# Patient Record
Sex: Female | Born: 1985 | Race: White | Hispanic: No | Marital: Single | Smoking: Never smoker
Health system: Southern US, Community
[De-identification: ages and names within clinical notes are randomized; demographics above are authoritative.]

---

## 2005-07-20 ENCOUNTER — Other Ambulatory Visit: Admission: RE | Admit: 2005-07-20 | Discharge: 2005-07-20 | Payer: Self-pay | Admitting: Gynecology

## 2016-04-09 ENCOUNTER — Ambulatory Visit (HOSPITAL_COMMUNITY)
Admission: EM | Admit: 2016-04-09 | Discharge: 2016-04-09 | Disposition: A | Discharge status: Home / Self Care | Payer: BLUE CROSS/BLUE SHIELD | Attending: Emergency Medicine | Admitting: Emergency Medicine

## 2016-04-09 ENCOUNTER — Encounter (HOSPITAL_COMMUNITY): Payer: Self-pay | Admitting: Emergency Medicine

## 2016-04-09 DIAGNOSIS — N898 Other specified noninflammatory disorders of vagina: Secondary | ICD-10-CM

## 2016-04-09 MED ORDER — HYDROCODONE-ACETAMINOPHEN 5-325 MG PO TABS: 1.0000 | ORAL_TABLET | ORAL | 0 refills | Status: AC | PRN

## 2016-04-09 NOTE — Discharge Instructions (Signed)
Please call your OB's office first thing Monday morning to be seen. Use the Vicodin every 4-6 hours as needed for pain. If you develop fevers or worsening pain despite the medication, please go to [facility].

## 2016-04-09 NOTE — ED Provider Notes (Signed)
[facility]    CSN: [id number] Arrival date & time: 04/09/16  1941     History   Chief Complaint Chief Complaint  Patient presents with  . Abdominal Pain    HPI Yvonne Gardner is a 30 y.o. female.   HPI  She is a 30 year old woman here for evaluation of abdominal pain and vaginal discharge. She states she had a LEEP procedure done on Wednesday. Yesterday, she developed some lower abdominal pain and foul-smelling discharge. Symptoms have worsened today. She has tried taking ibuprofen without improvement. She reports feeling nauseous from the pain, but no vomiting. Denies any fevers. Dr. Earnest RosierMeisnger is her OB/GYN.  History reviewed. No pertinent past medical history.  There are no active problems to display for this patient.   History reviewed. No pertinent surgical history.  OB History    No data available       Home Medications    Prior to Admission medications   Medication Sig Start Date End Date Taking? Authorizing Provider  levonorgestrel (MIRENA, 52 MG,) 20 MCG/24HR IUD 1 each by Intrauterine route once.   Yes Historical Provider, MD  Norethindrone Acetate-Ethinyl Estrad-FE (LOESTRIN 24 FE) 1-20 MG-MCG(24) tablet Take 1 tablet by mouth daily.   Yes Historical Provider, MD  HYDROcodone-acetaminophen (NORCO) 5-325 MG tablet Take 1-2 tablets by mouth every 4 (four) hours as needed for moderate pain. 04/09/16   Charm RingsErin J Arhianna Ebey, MD    Family History History reviewed. No pertinent family history.  Social History Social History  Substance Use Topics  . Smoking status: Never Smoker  . Smokeless tobacco: Never Used  . Alcohol use No     Allergies   Floraquin [iodoquinol]; Prednisolone; and Penicillins   Review of Systems Review of Systems As in history of present illness  Physical Exam Triage Vital Signs ED Triage Vitals  Enc Vitals Group     BP 04/09/16 2010 136/78     Pulse Rate 04/09/16 2010 82     Resp 04/09/16 2010 16     Temp 04/09/16  2010 97.8 F (36.6 C)     Temp Source 04/09/16 2010 Oral     SpO2 04/09/16 2010 100 %     Weight --      Height --      Head Circumference --      Peak Flow --      Pain Score 04/09/16 2009 10     Pain Loc --      Pain Edu? --      Excl. in GC? --    No data found.   Updated Vital Signs BP 136/78 (BP Location: Left Arm)   Pulse 82   Temp 97.8 F (36.6 C) (Oral)   Resp 16   SpO2 100%   Visual Acuity Right Eye Distance:   Left Eye Distance:   Bilateral Distance:    Right Eye Near:   Left Eye Near:    Bilateral Near:     Physical Exam  Constitutional: She is oriented to person, place, and time. She appears well-developed and well-nourished. No distress.  Cardiovascular: Normal rate.   Pulmonary/Chest: Effort normal.  Genitourinary:  Genitourinary Comments: Patient refuses pelvic exam  Neurological: She is alert and oriented to person, place, and time.     UC Treatments / Results  Labs (all labs ordered are listed, but only abnormal results are displayed) Labs Reviewed - No data to display  EKG  EKG Interpretation None       Radiology  No results found.  Procedures Procedures (including critical care time)  Medications Ordered in UC Medications - No data to display   Initial Impression / Assessment and Plan / UC Course  I have reviewed the triage vital signs and the nursing notes.  Pertinent labs & imaging results that were available during my care of the patient were reviewed by me and considered in my medical decision making (see chart for details).  Clinical Course     I called and spoke with Dr. Ellyn HackBovard, OB[facility]GY[facility]. She recommended a pelvic exam. I went back in and discussed with the patient that a pelvic exam with needed to assess the cervix and how it was healing. Patient continues to refuse a pelvic exam due to concerns of pain. We'll provide prescription for hydrocodone to use as needed for pain. [facility] to [facility] first thing Monday morning for an appointment. If pain is worsening despite pain medication or she develops fevers, she is to [facility].  Final Clinical Impressions(s) / UC Diagnoses   Final diagnoses:  Vaginal discharge    New Prescriptions Discharge Medication List as of 04/09/2016  8:31 PM    START taking these medications   Details  HYDROcodone-acetaminophen (NORCO) 5-325 MG tablet Take 1-2 tablets by mouth every 4 (four) hours as needed for moderate pain., Starting Sat 04/09/2016, Print         Charm RingsErin J Nikea Settle, MD 04/09/16 2037

## 2016-04-09 NOTE — ED Triage Notes (Signed)
[facility] with a complaint of lower abdominal pain and foul smelling discharge. Patient states that she had a LEEP procedure down on Weds. Of this week. Her GYN told her if she started to experience pain to be seen. She states that she has a foul smelling, blood tinged discharge.

## 2017-06-28 DIAGNOSIS — J0100 Acute maxillary sinusitis, unspecified: Secondary | ICD-10-CM | POA: Diagnosis not present

## 2017-07-19 DIAGNOSIS — F900 Attention-deficit hyperactivity disorder, predominantly inattentive type: Secondary | ICD-10-CM | POA: Diagnosis not present

## 2017-10-11 DIAGNOSIS — F900 Attention-deficit hyperactivity disorder, predominantly inattentive type: Secondary | ICD-10-CM | POA: Diagnosis not present

## 2017-12-04 DIAGNOSIS — N870 Mild cervical dysplasia: Secondary | ICD-10-CM | POA: Diagnosis not present

## 2017-12-04 DIAGNOSIS — Z130 Encounter for screening for diseases of the blood and blood-forming organs and certain disorders involving the immune mechanism: Secondary | ICD-10-CM | POA: Diagnosis not present

## 2017-12-04 DIAGNOSIS — Z1389 Encounter for screening for other disorder: Secondary | ICD-10-CM | POA: Diagnosis not present

## 2017-12-04 DIAGNOSIS — Z01419 Encounter for gynecological examination (general) (routine) without abnormal findings: Secondary | ICD-10-CM | POA: Diagnosis not present

## 2017-12-04 DIAGNOSIS — Z87410 Personal history of cervical dysplasia: Secondary | ICD-10-CM | POA: Diagnosis not present

## 2017-12-19 DIAGNOSIS — Z30433 Encounter for removal and reinsertion of intrauterine contraceptive device: Secondary | ICD-10-CM | POA: Diagnosis not present

## 2018-01-03 DIAGNOSIS — F900 Attention-deficit hyperactivity disorder, predominantly inattentive type: Secondary | ICD-10-CM | POA: Diagnosis not present

## 2018-04-11 DIAGNOSIS — F900 Attention-deficit hyperactivity disorder, predominantly inattentive type: Secondary | ICD-10-CM | POA: Diagnosis not present

## 2018-07-04 DIAGNOSIS — F900 Attention-deficit hyperactivity disorder, predominantly inattentive type: Secondary | ICD-10-CM | POA: Diagnosis not present

## 2018-08-06 DIAGNOSIS — J0100 Acute maxillary sinusitis, unspecified: Secondary | ICD-10-CM | POA: Diagnosis not present

## 2018-08-10 ENCOUNTER — Ambulatory Visit
Admission: RE | Admit: 2018-08-10 | Discharge: 2018-08-10 | Disposition: A | Discharge status: Home / Self Care | Payer: BLUE CROSS/BLUE SHIELD | Source: Ambulatory Visit | Attending: Family Medicine | Admitting: Family Medicine

## 2018-08-10 ENCOUNTER — Other Ambulatory Visit: Payer: Self-pay | Admitting: Family Medicine

## 2018-08-10 ENCOUNTER — Other Ambulatory Visit: Payer: Self-pay

## 2018-08-10 DIAGNOSIS — Y999 Unspecified external cause status: Secondary | ICD-10-CM | POA: Diagnosis not present

## 2018-08-10 DIAGNOSIS — S0033XA Contusion of nose, initial encounter: Secondary | ICD-10-CM | POA: Diagnosis not present

## 2018-08-10 DIAGNOSIS — J3489 Other specified disorders of nose and nasal sinuses: Secondary | ICD-10-CM

## 2018-08-10 DIAGNOSIS — S0993XA Unspecified injury of face, initial encounter: Secondary | ICD-10-CM | POA: Diagnosis not present

## 2018-08-10 DIAGNOSIS — S0992XD Unspecified injury of nose, subsequent encounter: Secondary | ICD-10-CM

## 2018-08-10 DIAGNOSIS — W228XXA Striking against or struck by other objects, initial encounter: Secondary | ICD-10-CM | POA: Diagnosis not present

## 2018-08-10 DIAGNOSIS — R51 Headache: Secondary | ICD-10-CM | POA: Diagnosis not present

## 2018-08-10 DIAGNOSIS — W541XXA Struck by dog, initial encounter: Secondary | ICD-10-CM | POA: Diagnosis not present

## 2018-09-19 DIAGNOSIS — F900 Attention-deficit hyperactivity disorder, predominantly inattentive type: Secondary | ICD-10-CM | POA: Diagnosis not present

## 2018-12-19 DIAGNOSIS — F900 Attention-deficit hyperactivity disorder, predominantly inattentive type: Secondary | ICD-10-CM | POA: Diagnosis not present

## 2018-12-31 DIAGNOSIS — Z01419 Encounter for gynecological examination (general) (routine) without abnormal findings: Secondary | ICD-10-CM | POA: Diagnosis not present

## 2018-12-31 DIAGNOSIS — Z130 Encounter for screening for diseases of the blood and blood-forming organs and certain disorders involving the immune mechanism: Secondary | ICD-10-CM | POA: Diagnosis not present

## 2018-12-31 DIAGNOSIS — Z87410 Personal history of cervical dysplasia: Secondary | ICD-10-CM | POA: Diagnosis not present

## 2018-12-31 DIAGNOSIS — Z1389 Encounter for screening for other disorder: Secondary | ICD-10-CM | POA: Diagnosis not present

## 2019-01-01 DIAGNOSIS — Z87410 Personal history of cervical dysplasia: Secondary | ICD-10-CM | POA: Diagnosis not present

## 2019-03-20 DIAGNOSIS — F900 Attention-deficit hyperactivity disorder, predominantly inattentive type: Secondary | ICD-10-CM | POA: Diagnosis not present

## 2019-06-24 DIAGNOSIS — F900 Attention-deficit hyperactivity disorder, predominantly inattentive type: Secondary | ICD-10-CM | POA: Diagnosis not present

## 2019-08-21 DIAGNOSIS — F900 Attention-deficit hyperactivity disorder, predominantly inattentive type: Secondary | ICD-10-CM | POA: Diagnosis not present

## 2019-11-13 DIAGNOSIS — F900 Attention-deficit hyperactivity disorder, predominantly inattentive type: Secondary | ICD-10-CM | POA: Diagnosis not present

## 2019-12-05 DIAGNOSIS — F4323 Adjustment disorder with mixed anxiety and depressed mood: Secondary | ICD-10-CM | POA: Diagnosis not present

## 2019-12-05 DIAGNOSIS — F430 Acute stress reaction: Secondary | ICD-10-CM | POA: Diagnosis not present

## 2019-12-12 DIAGNOSIS — F430 Acute stress reaction: Secondary | ICD-10-CM | POA: Diagnosis not present

## 2019-12-12 DIAGNOSIS — F4323 Adjustment disorder with mixed anxiety and depressed mood: Secondary | ICD-10-CM | POA: Diagnosis not present

## 2019-12-19 DIAGNOSIS — F4323 Adjustment disorder with mixed anxiety and depressed mood: Secondary | ICD-10-CM | POA: Diagnosis not present

## 2019-12-19 DIAGNOSIS — F430 Acute stress reaction: Secondary | ICD-10-CM | POA: Diagnosis not present

## 2019-12-26 DIAGNOSIS — F4323 Adjustment disorder with mixed anxiety and depressed mood: Secondary | ICD-10-CM | POA: Diagnosis not present

## 2019-12-26 DIAGNOSIS — F430 Acute stress reaction: Secondary | ICD-10-CM | POA: Diagnosis not present

## 2020-01-02 DIAGNOSIS — F4323 Adjustment disorder with mixed anxiety and depressed mood: Secondary | ICD-10-CM | POA: Diagnosis not present

## 2020-01-09 DIAGNOSIS — F4323 Adjustment disorder with mixed anxiety and depressed mood: Secondary | ICD-10-CM | POA: Diagnosis not present

## 2020-01-10 DIAGNOSIS — Z77098 Contact with and (suspected) exposure to other hazardous, chiefly nonmedicinal, chemicals: Secondary | ICD-10-CM | POA: Diagnosis not present

## 2020-01-16 DIAGNOSIS — F4323 Adjustment disorder with mixed anxiety and depressed mood: Secondary | ICD-10-CM | POA: Diagnosis not present

## 2020-01-23 DIAGNOSIS — F4323 Adjustment disorder with mixed anxiety and depressed mood: Secondary | ICD-10-CM | POA: Diagnosis not present

## 2020-01-30 DIAGNOSIS — F430 Acute stress reaction: Secondary | ICD-10-CM | POA: Diagnosis not present

## 2020-02-06 DIAGNOSIS — F430 Acute stress reaction: Secondary | ICD-10-CM | POA: Diagnosis not present

## 2020-02-12 DIAGNOSIS — F900 Attention-deficit hyperactivity disorder, predominantly inattentive type: Secondary | ICD-10-CM | POA: Diagnosis not present

## 2020-02-13 DIAGNOSIS — F430 Acute stress reaction: Secondary | ICD-10-CM | POA: Diagnosis not present

## 2020-02-18 DIAGNOSIS — Z77011 Contact with and (suspected) exposure to lead: Secondary | ICD-10-CM | POA: Diagnosis not present

## 2020-02-18 DIAGNOSIS — R5382 Chronic fatigue, unspecified: Secondary | ICD-10-CM | POA: Diagnosis not present

## 2020-02-20 DIAGNOSIS — F430 Acute stress reaction: Secondary | ICD-10-CM | POA: Diagnosis not present

## 2020-02-25 ENCOUNTER — Ambulatory Visit
Admission: RE | Admit: 2020-02-25 | Discharge: 2020-02-25 | Disposition: A | Discharge status: Home / Self Care | Payer: BLUE CROSS/BLUE SHIELD | Source: Ambulatory Visit | Attending: Family Medicine | Admitting: Family Medicine

## 2020-02-25 ENCOUNTER — Other Ambulatory Visit: Payer: Self-pay | Admitting: Family Medicine

## 2020-02-25 DIAGNOSIS — R059 Cough, unspecified: Secondary | ICD-10-CM

## 2020-02-27 DIAGNOSIS — F430 Acute stress reaction: Secondary | ICD-10-CM | POA: Diagnosis not present

## 2020-03-09 DIAGNOSIS — Z20822 Contact with and (suspected) exposure to covid-19: Secondary | ICD-10-CM | POA: Diagnosis not present

## 2020-03-10 DIAGNOSIS — R059 Cough, unspecified: Secondary | ICD-10-CM | POA: Diagnosis not present

## 2020-03-13 DIAGNOSIS — Z20828 Contact with and (suspected) exposure to other viral communicable diseases: Secondary | ICD-10-CM | POA: Diagnosis not present

## 2020-03-19 DIAGNOSIS — F430 Acute stress reaction: Secondary | ICD-10-CM | POA: Diagnosis not present

## 2020-03-26 DIAGNOSIS — F430 Acute stress reaction: Secondary | ICD-10-CM | POA: Diagnosis not present

## 2020-04-09 DIAGNOSIS — F430 Acute stress reaction: Secondary | ICD-10-CM | POA: Diagnosis not present

## 2020-04-16 DIAGNOSIS — F430 Acute stress reaction: Secondary | ICD-10-CM | POA: Diagnosis not present

## 2020-04-23 DIAGNOSIS — F430 Acute stress reaction: Secondary | ICD-10-CM | POA: Diagnosis not present

## 2020-04-30 DIAGNOSIS — F430 Acute stress reaction: Secondary | ICD-10-CM | POA: Diagnosis not present

## 2020-05-04 DIAGNOSIS — Z20822 Contact with and (suspected) exposure to covid-19: Secondary | ICD-10-CM | POA: Diagnosis not present

## 2020-05-13 DIAGNOSIS — F900 Attention-deficit hyperactivity disorder, predominantly inattentive type: Secondary | ICD-10-CM | POA: Diagnosis not present

## 2020-05-14 DIAGNOSIS — F4323 Adjustment disorder with mixed anxiety and depressed mood: Secondary | ICD-10-CM | POA: Diagnosis not present

## 2020-05-15 DIAGNOSIS — Z20822 Contact with and (suspected) exposure to covid-19: Secondary | ICD-10-CM | POA: Diagnosis not present

## 2020-05-21 DIAGNOSIS — F4323 Adjustment disorder with mixed anxiety and depressed mood: Secondary | ICD-10-CM | POA: Diagnosis not present

## 2020-05-28 DIAGNOSIS — F4323 Adjustment disorder with mixed anxiety and depressed mood: Secondary | ICD-10-CM | POA: Diagnosis not present

## 2020-06-04 DIAGNOSIS — F4323 Adjustment disorder with mixed anxiety and depressed mood: Secondary | ICD-10-CM | POA: Diagnosis not present

## 2020-06-11 DIAGNOSIS — F4323 Adjustment disorder with mixed anxiety and depressed mood: Secondary | ICD-10-CM | POA: Diagnosis not present

## 2020-06-18 DIAGNOSIS — F4323 Adjustment disorder with mixed anxiety and depressed mood: Secondary | ICD-10-CM | POA: Diagnosis not present

## 2020-06-25 DIAGNOSIS — F4323 Adjustment disorder with mixed anxiety and depressed mood: Secondary | ICD-10-CM | POA: Diagnosis not present

## 2020-07-02 DIAGNOSIS — F4323 Adjustment disorder with mixed anxiety and depressed mood: Secondary | ICD-10-CM | POA: Diagnosis not present

## 2020-07-07 DIAGNOSIS — H00019 Hordeolum externum unspecified eye, unspecified eyelid: Secondary | ICD-10-CM | POA: Diagnosis not present

## 2020-07-09 DIAGNOSIS — F4323 Adjustment disorder with mixed anxiety and depressed mood: Secondary | ICD-10-CM | POA: Diagnosis not present

## 2020-07-23 DIAGNOSIS — F4323 Adjustment disorder with mixed anxiety and depressed mood: Secondary | ICD-10-CM | POA: Diagnosis not present

## 2020-07-30 DIAGNOSIS — F4323 Adjustment disorder with mixed anxiety and depressed mood: Secondary | ICD-10-CM | POA: Diagnosis not present

## 2020-08-06 DIAGNOSIS — F4310 Post-traumatic stress disorder, unspecified: Secondary | ICD-10-CM | POA: Diagnosis not present

## 2020-08-12 DIAGNOSIS — F900 Attention-deficit hyperactivity disorder, predominantly inattentive type: Secondary | ICD-10-CM | POA: Diagnosis not present

## 2020-08-13 DIAGNOSIS — F4310 Post-traumatic stress disorder, unspecified: Secondary | ICD-10-CM | POA: Diagnosis not present

## 2020-08-20 DIAGNOSIS — F4310 Post-traumatic stress disorder, unspecified: Secondary | ICD-10-CM | POA: Diagnosis not present

## 2020-08-27 DIAGNOSIS — F4310 Post-traumatic stress disorder, unspecified: Secondary | ICD-10-CM | POA: Diagnosis not present

## 2020-09-03 DIAGNOSIS — F4310 Post-traumatic stress disorder, unspecified: Secondary | ICD-10-CM | POA: Diagnosis not present

## 2020-09-10 DIAGNOSIS — F4323 Adjustment disorder with mixed anxiety and depressed mood: Secondary | ICD-10-CM | POA: Diagnosis not present

## 2020-09-17 DIAGNOSIS — F4310 Post-traumatic stress disorder, unspecified: Secondary | ICD-10-CM | POA: Diagnosis not present

## 2020-09-24 DIAGNOSIS — F4310 Post-traumatic stress disorder, unspecified: Secondary | ICD-10-CM | POA: Diagnosis not present

## 2020-10-01 DIAGNOSIS — F4310 Post-traumatic stress disorder, unspecified: Secondary | ICD-10-CM | POA: Diagnosis not present

## 2020-10-08 DIAGNOSIS — F4310 Post-traumatic stress disorder, unspecified: Secondary | ICD-10-CM | POA: Diagnosis not present

## 2020-10-15 DIAGNOSIS — F4310 Post-traumatic stress disorder, unspecified: Secondary | ICD-10-CM | POA: Diagnosis not present

## 2020-10-22 DIAGNOSIS — F4310 Post-traumatic stress disorder, unspecified: Secondary | ICD-10-CM | POA: Diagnosis not present

## 2020-10-29 DIAGNOSIS — F4310 Post-traumatic stress disorder, unspecified: Secondary | ICD-10-CM | POA: Diagnosis not present

## 2020-11-05 DIAGNOSIS — F4310 Post-traumatic stress disorder, unspecified: Secondary | ICD-10-CM | POA: Diagnosis not present

## 2020-11-18 DIAGNOSIS — F900 Attention-deficit hyperactivity disorder, predominantly inattentive type: Secondary | ICD-10-CM | POA: Diagnosis not present

## 2021-01-23 DIAGNOSIS — R6883 Chills (without fever): Secondary | ICD-10-CM | POA: Diagnosis not present

## 2021-01-23 DIAGNOSIS — M7989 Other specified soft tissue disorders: Secondary | ICD-10-CM | POA: Diagnosis not present

## 2021-01-23 DIAGNOSIS — M79606 Pain in leg, unspecified: Secondary | ICD-10-CM | POA: Diagnosis not present

## 2021-01-23 DIAGNOSIS — R233 Spontaneous ecchymoses: Secondary | ICD-10-CM | POA: Diagnosis not present

## 2021-02-10 DIAGNOSIS — F900 Attention-deficit hyperactivity disorder, predominantly inattentive type: Secondary | ICD-10-CM | POA: Diagnosis not present

## 2021-05-12 DIAGNOSIS — F900 Attention-deficit hyperactivity disorder, predominantly inattentive type: Secondary | ICD-10-CM | POA: Diagnosis not present

## 2021-07-07 DIAGNOSIS — F900 Attention-deficit hyperactivity disorder, predominantly inattentive type: Secondary | ICD-10-CM | POA: Diagnosis not present

## 2021-09-01 DIAGNOSIS — F900 Attention-deficit hyperactivity disorder, predominantly inattentive type: Secondary | ICD-10-CM | POA: Diagnosis not present

## 2021-12-25 IMAGING — CR DG CHEST 2V
2 series · 2 of 2 positions shown · non-contrast
Comparison: None.

CLINICAL DATA: Cough

EXAM:
CHEST - 2 VIEW

[w chest pa]
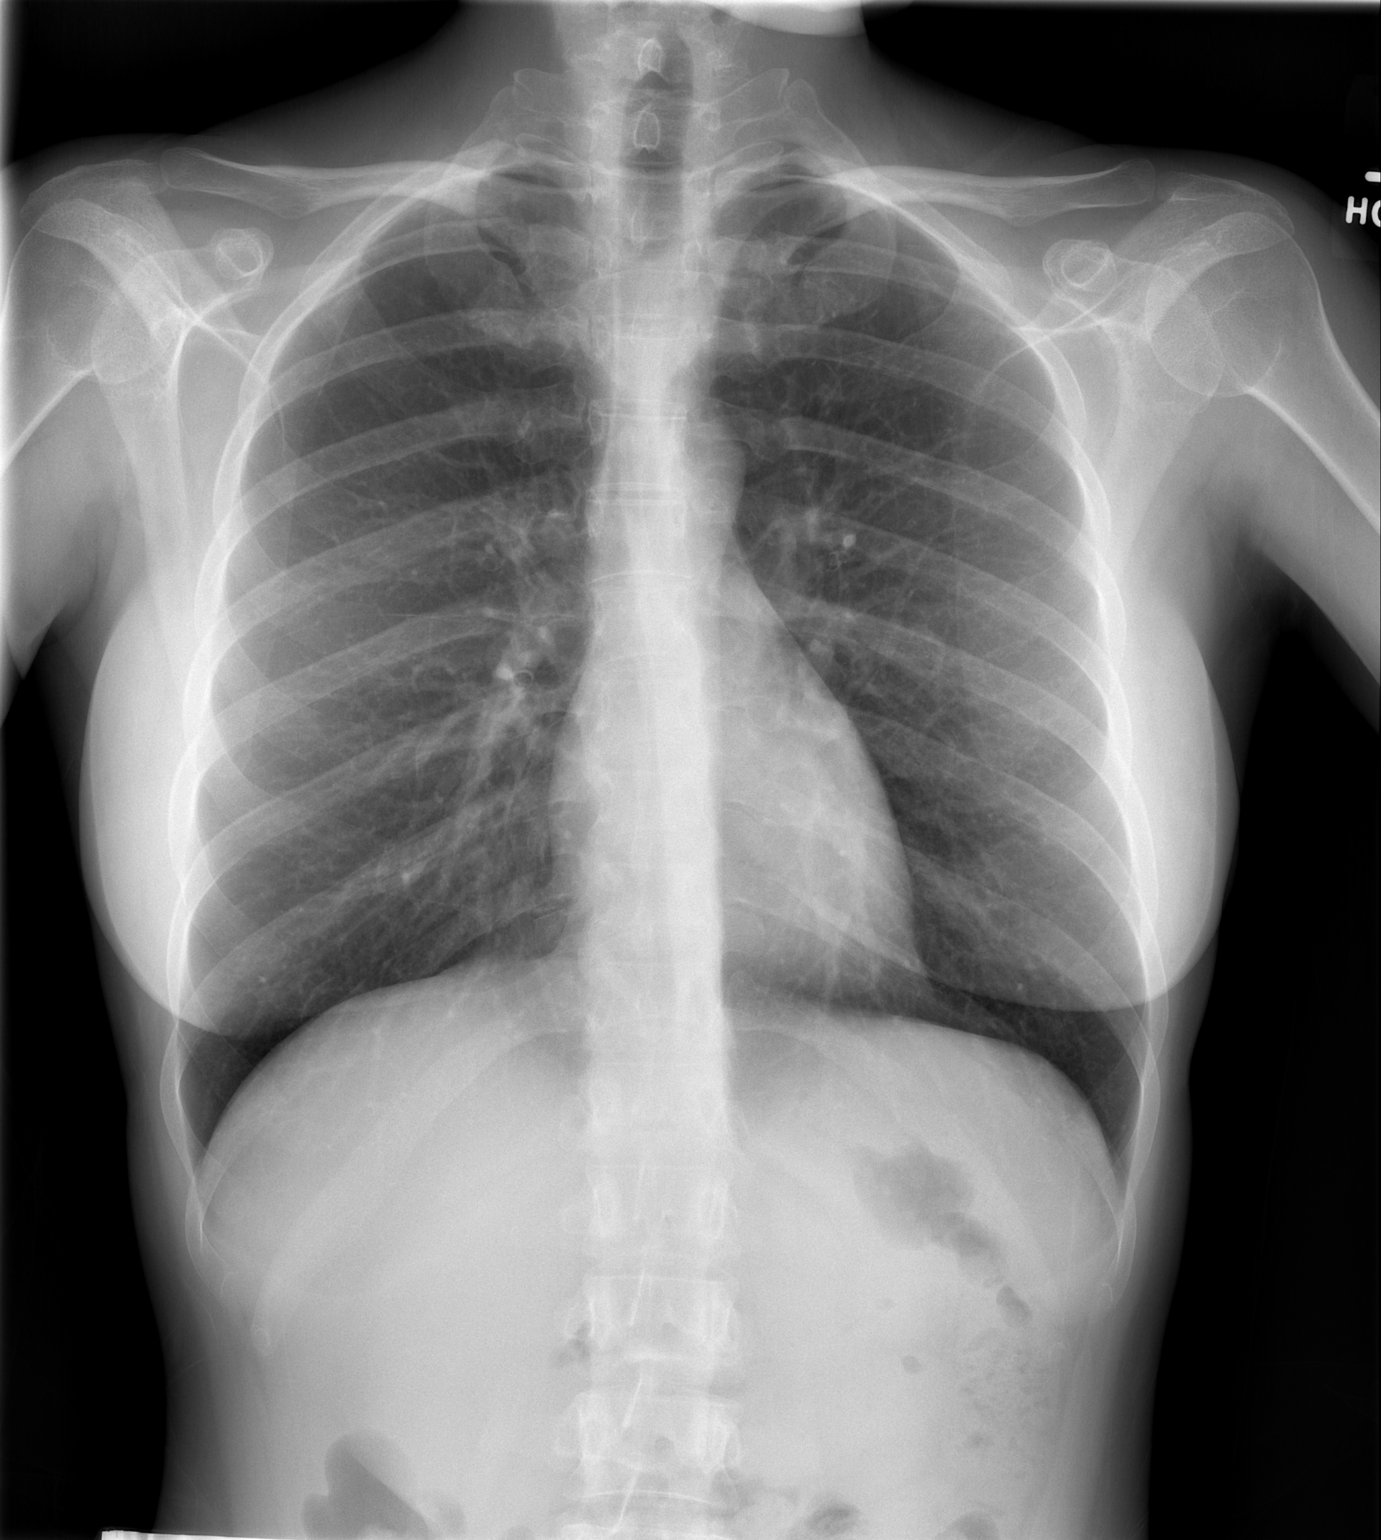

[w chest lat]
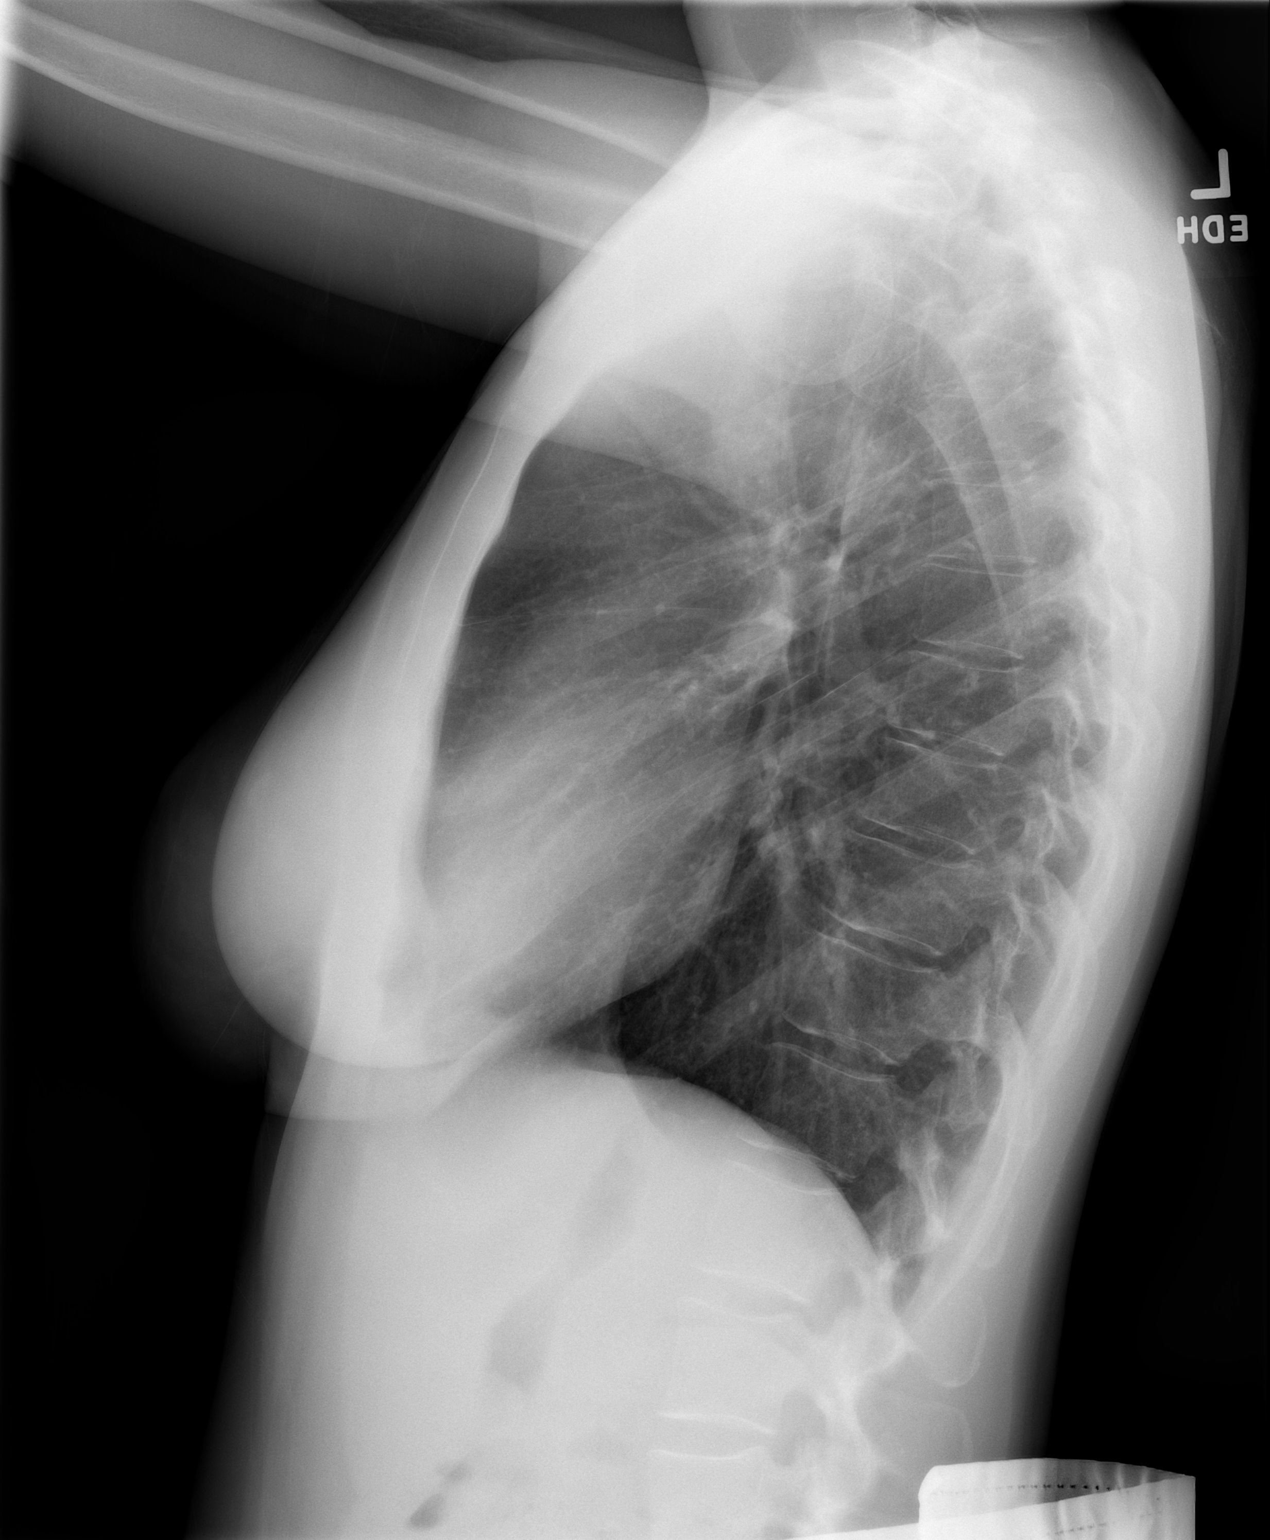

[2 of 2 positions shown; findings below may reference images not displayed]

FINDINGS: The heart size and mediastinal contours are within normal limits.
Both lungs are clear. The visualized skeletal structures are
unremarkable.
IMPRESSION: No active cardiopulmonary disease.

## 2022-02-16 DIAGNOSIS — F900 Attention-deficit hyperactivity disorder, predominantly inattentive type: Secondary | ICD-10-CM | POA: Diagnosis not present

## 2022-05-13 DIAGNOSIS — J3089 Other allergic rhinitis: Secondary | ICD-10-CM | POA: Diagnosis not present

## 2022-05-13 DIAGNOSIS — J0100 Acute maxillary sinusitis, unspecified: Secondary | ICD-10-CM | POA: Diagnosis not present

## 2022-05-24 DIAGNOSIS — F900 Attention-deficit hyperactivity disorder, predominantly inattentive type: Secondary | ICD-10-CM | POA: Diagnosis not present

## 2022-07-28 DIAGNOSIS — F900 Attention-deficit hyperactivity disorder, predominantly inattentive type: Secondary | ICD-10-CM | POA: Diagnosis not present

## 2022-10-05 DIAGNOSIS — F900 Attention-deficit hyperactivity disorder, predominantly inattentive type: Secondary | ICD-10-CM | POA: Diagnosis not present

## 2022-12-21 DIAGNOSIS — F900 Attention-deficit hyperactivity disorder, predominantly inattentive type: Secondary | ICD-10-CM | POA: Diagnosis not present

## 2023-03-08 DIAGNOSIS — F900 Attention-deficit hyperactivity disorder, predominantly inattentive type: Secondary | ICD-10-CM | POA: Diagnosis not present

## 2023-05-07 ENCOUNTER — Telehealth: Payer: BC Managed Care – PPO

## 2023-05-07 DIAGNOSIS — R042 Hemoptysis: Secondary | ICD-10-CM | POA: Diagnosis not present

## 2023-05-07 DIAGNOSIS — R5383 Other fatigue: Secondary | ICD-10-CM | POA: Diagnosis not present

## 2023-05-17 DIAGNOSIS — F900 Attention-deficit hyperactivity disorder, predominantly inattentive type: Secondary | ICD-10-CM | POA: Diagnosis not present

## 2023-10-25 DIAGNOSIS — F900 Attention-deficit hyperactivity disorder, predominantly inattentive type: Secondary | ICD-10-CM | POA: Diagnosis not present

## 2023-12-13 DIAGNOSIS — F900 Attention-deficit hyperactivity disorder, predominantly inattentive type: Secondary | ICD-10-CM | POA: Diagnosis not present

## 2024-03-06 DIAGNOSIS — F900 Attention-deficit hyperactivity disorder, predominantly inattentive type: Secondary | ICD-10-CM | POA: Diagnosis not present
